# Patient Record
Sex: Female | Born: 1975 | Race: Black or African American | Hispanic: No | Marital: Single | Smoking: Never smoker
Health system: Southern US, Community
[De-identification: ages and names within clinical notes are randomized; demographics above are authoritative.]

---

## 2004-12-22 ENCOUNTER — Other Ambulatory Visit: Admission: RE | Admit: 2004-12-22 | Discharge: 2004-12-22 | Payer: Self-pay | Admitting: Obstetrics and Gynecology

## 2005-05-06 ENCOUNTER — Emergency Department (HOSPITAL_COMMUNITY): Admission: EM | Admit: 2005-05-06 | Discharge: 2005-05-06 | Payer: Self-pay | Admitting: Emergency Medicine

## 2005-05-06 ENCOUNTER — Emergency Department (HOSPITAL_COMMUNITY): Admission: EM | Admit: 2005-05-06 | Discharge: 2005-05-07 | Payer: Self-pay | Admitting: Emergency Medicine

## 2005-06-25 ENCOUNTER — Ambulatory Visit (HOSPITAL_COMMUNITY): Admission: RE | Admit: 2005-06-25 | Discharge: 2005-06-25 | Payer: Self-pay

## 2006-02-04 ENCOUNTER — Other Ambulatory Visit: Admission: RE | Admit: 2006-02-04 | Discharge: 2006-02-04 | Payer: Self-pay | Admitting: Obstetrics and Gynecology

## 2006-08-31 IMAGING — US US ABDOMEN COMPLETE
1 series · 14 of 25 positions shown · non-contrast
Comparison: none

CLINICAL DATA: Abdominal pain

[Series 1: unknown · 0.34mm/px · 14 of 54 slices shown]
[im 1/54]
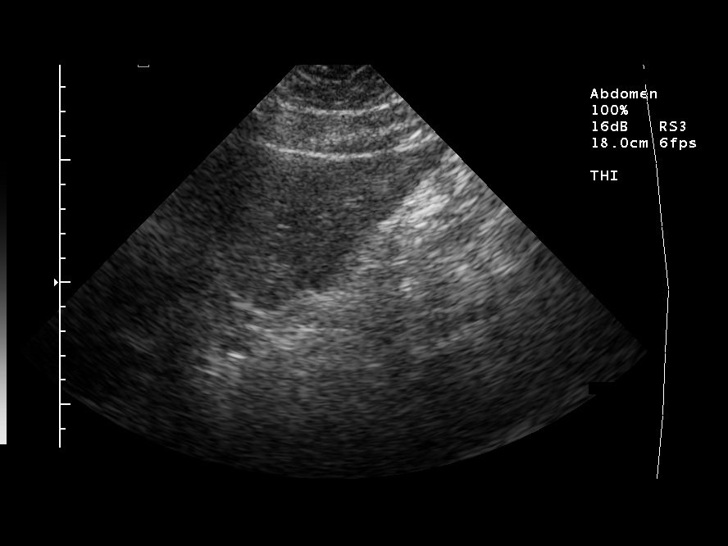
[im 5/54]
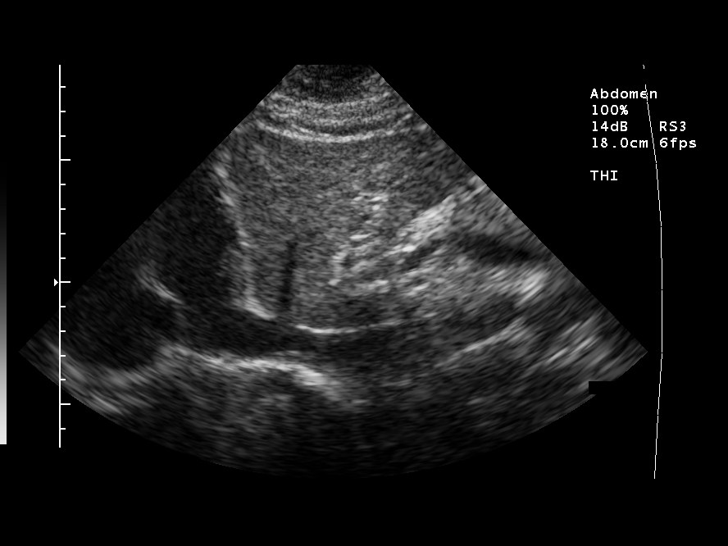
[im 9/54]
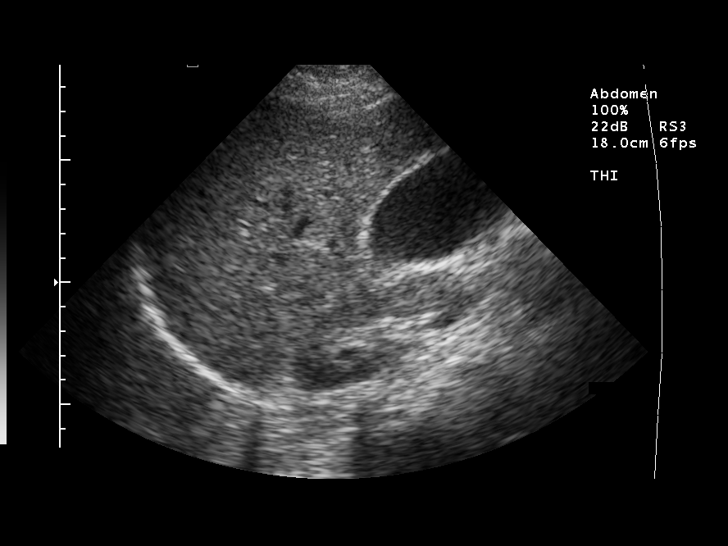
[im 14/54]
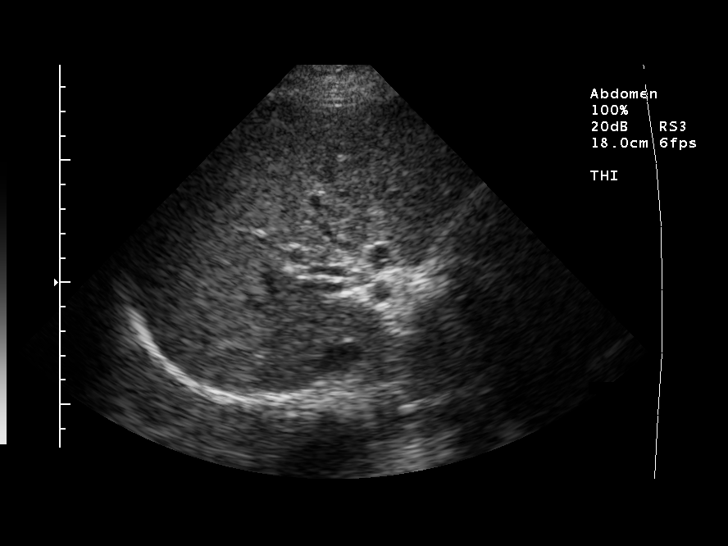
[im 18/54]
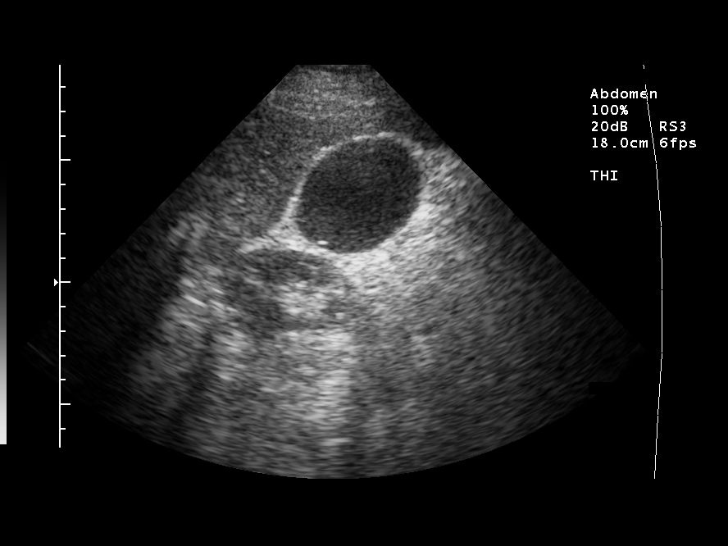
[im 20/54]
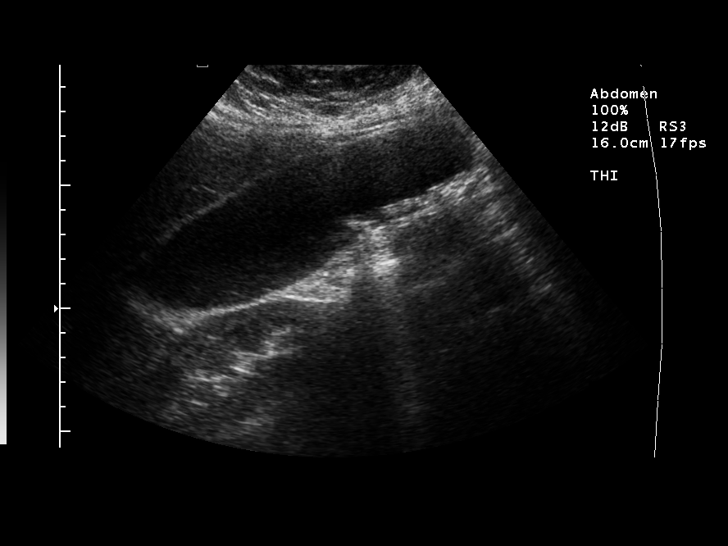
[im 25/54]
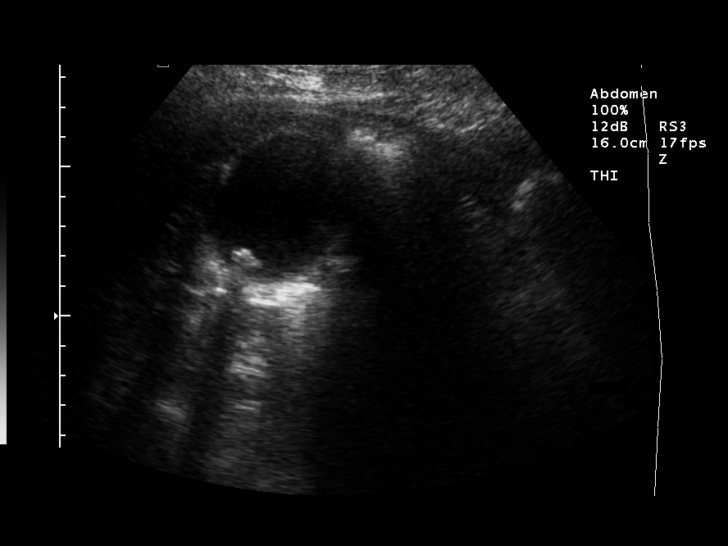
[im 29/54]
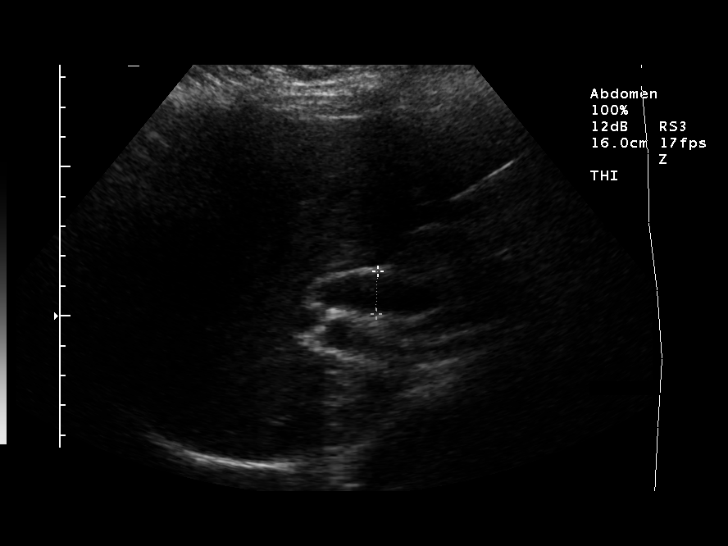
[im 34/54]
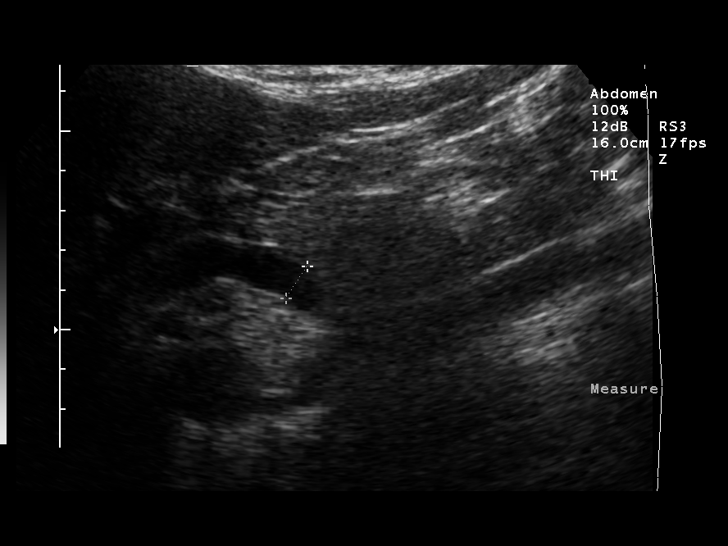
[im 36/54]
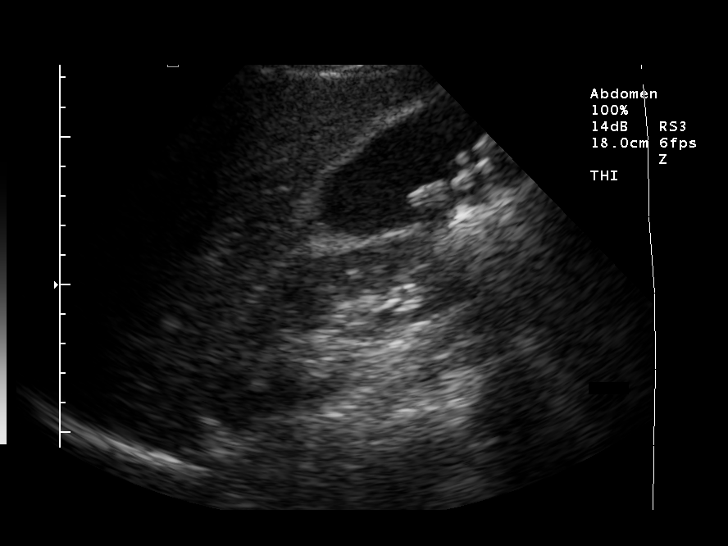
[im 40/54]
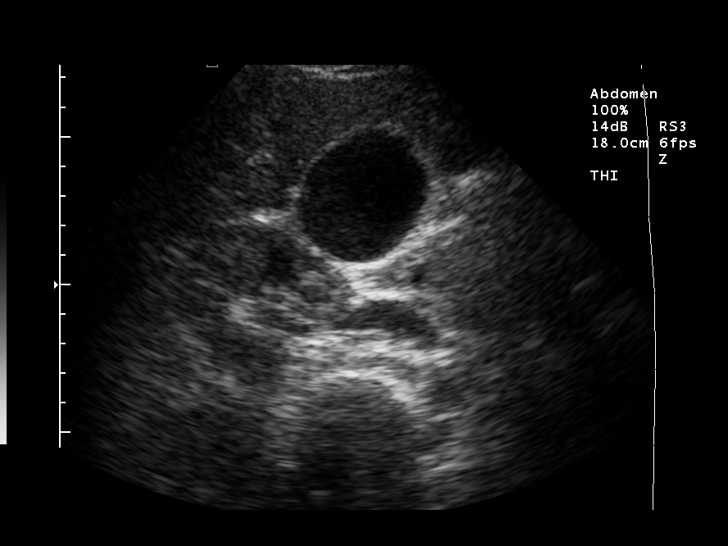
[im 45/54]
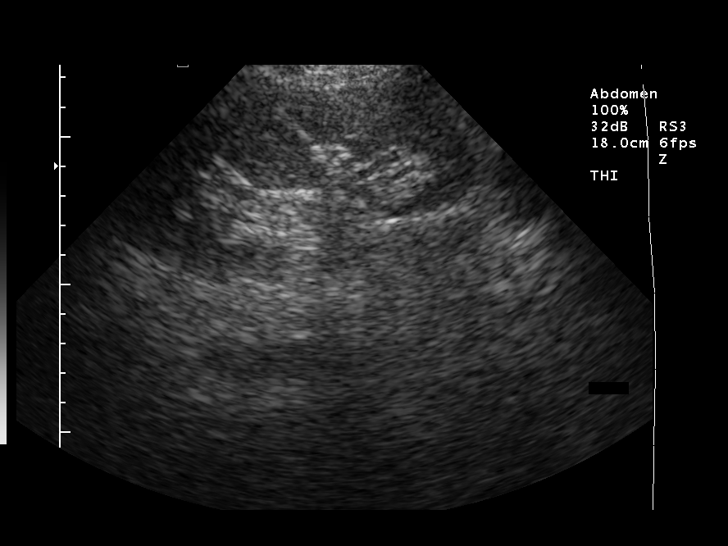
[im 49/54]
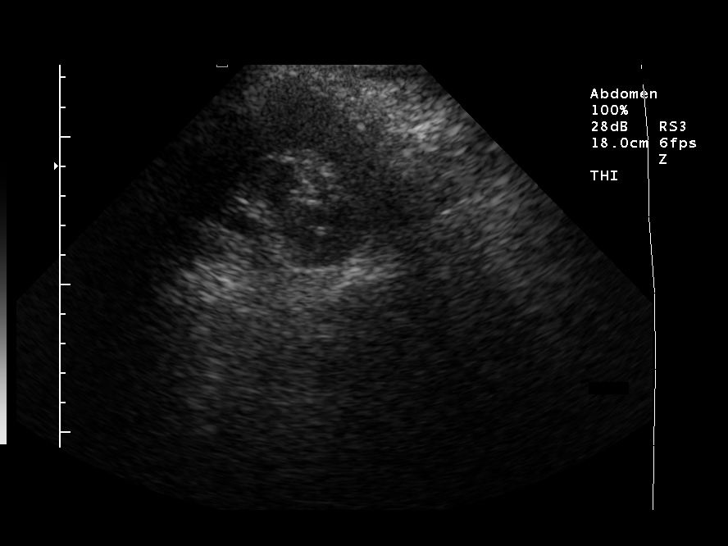
[im 54/54]
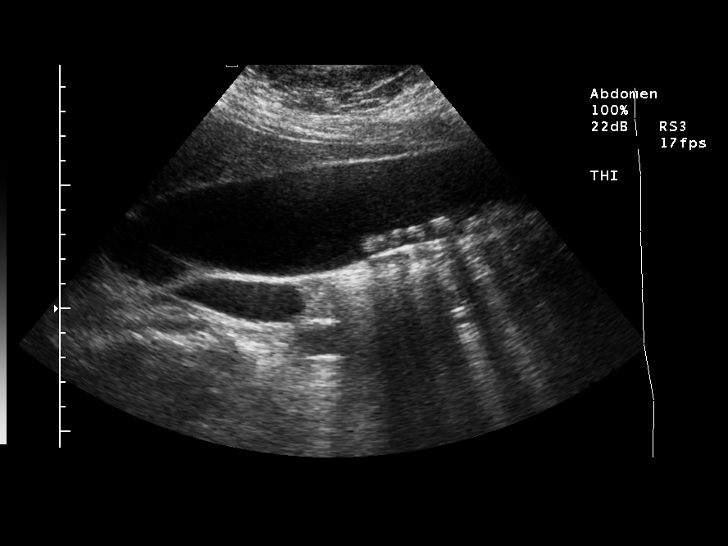

[14 of 25 positions shown; findings below may reference images not displayed]

Abdomen ultrasound:

No previous for comparison. Liver homogeneous in echotexture without focal
lesion or bile duct dilatation. Gallbladder is distended containing multiple
subcentimeter stones in its dependent portion. Gallbladder wall thickness
normal. No pericholecystic fluid. A positive sonographic Murphy's sign however
is elicited. The common bile duct is dilated to 14 mm. Pancreas, IVC, abdominal
aorta unremarkable. Spleen and kidneys unremarkable. No ascites.
IMPRESSION: 1. Cholelithiasis. A positive sonographic Murphy's sign would suggest acute
cholecystitis.
2. Dilated common bile duct although no definite intrahepatic bile duct
dilatation is identified.

## 2006-11-15 HISTORY — PX: OTHER SURGICAL HISTORY: SHX169

## 2012-11-12 ENCOUNTER — Ambulatory Visit (INDEPENDENT_AMBULATORY_CARE_PROVIDER_SITE_OTHER): Payer: BC Managed Care – PPO | Admitting: Family Medicine

## 2012-11-12 ENCOUNTER — Telehealth: Payer: Self-pay

## 2012-11-12 VITALS — BP 120/78 | HR 90 | Temp 98.4°F | Resp 16 | Ht 68.5 in | Wt 251.0 lb

## 2012-11-12 DIAGNOSIS — Z23 Encounter for immunization: Secondary | ICD-10-CM

## 2012-11-12 DIAGNOSIS — S61011A Laceration without foreign body of right thumb without damage to nail, initial encounter: Secondary | ICD-10-CM

## 2012-11-12 DIAGNOSIS — S61209A Unspecified open wound of unspecified finger without damage to nail, initial encounter: Secondary | ICD-10-CM

## 2012-11-12 DIAGNOSIS — M25549 Pain in joints of unspecified hand: Secondary | ICD-10-CM

## 2012-11-12 NOTE — Patient Instructions (Addendum)
WOUND CARE Please return in 10-12 days to have your stitches/staples removed or sooner if you have concerns. . Keep area clean and dry for 24 hours. Do not remove bandage, if applied. . After 24 hours, remove bandage and wash wound gently with mild soap and warm water. Reapply a new bandage after cleaning wound, if directed. . Continue daily cleansing with soap and water until stitches/staples are removed. . Do not apply any ointments or creams to the wound while stitches/staples are in place, as this may cause delayed healing. . Notify the office if you experience any of the following signs of infection: Swelling, redness, pus drainage, streaking, fever >101.0 F . Notify the office if you experience excessive bleeding that does not stop after 15-20 minutes of constant, firm pressure.   

## 2012-11-12 NOTE — Progress Notes (Signed)
Subjective: Patient was cutting onions and cut her right thumb. She was using a slicer and slipped and fell across it. She cut a long sliver of tissue along the radial aspect of the right thumb. There is a little slice into the nail. It is planned a lot. Her tetanus shot probably is not up-to-date.  Objective: Sliver of tissue greater than 2 CM small along the side of her right thumb distal phalanx. This is fairly superficial. There is a small cut of the nail.  Assessment: Wound right thumb  Plan: Physician assistant will do the suture repair.  TDAP

## 2012-11-12 NOTE — Progress Notes (Signed)
Patient ID: ELKE HOLTRY MRN: [id number], DOB: 1975/12/31, 36 y.o. Date of Encounter: 11/12/2012, 6:19 PM   PROCEDURE NOTE: Verbal consent obtained. Sterile technique employed. Numbing: Anesthesia obtained with 2% lidocaine plain.  Cleansed with soap and water. Irrigated.  Wound explored, no deep structures involved, no foreign bodies.   Wound repaired with 4-0 ethilon. Flap tacked down.   Hemostasis obtained. Wound cleansed and dressed.  Wound care instructions including precautions covered with patient. Handout given.  Anticipate suture removal in 10-12 days.   Rhoderick Moody, PA-C 11/12/2012 6:19 PM

## 2012-11-23 NOTE — Telephone Encounter (Signed)
Error

## 2012-11-27 ENCOUNTER — Ambulatory Visit (INDEPENDENT_AMBULATORY_CARE_PROVIDER_SITE_OTHER): Payer: BC Managed Care – PPO | Admitting: Family Medicine

## 2012-11-27 VITALS — BP 119/80 | HR 80 | Temp 98.0°F | Resp 16 | Ht 68.5 in | Wt 256.6 lb

## 2012-11-27 DIAGNOSIS — Z4802 Encounter for removal of sutures: Secondary | ICD-10-CM

## 2012-11-27 NOTE — Progress Notes (Signed)
  Subjective:    Patient ID: Patricia Joseph, female    DOB: 1976/05/26, 37 y.o.   MRN: [id number]  HPI  Pt here for suture removal.  Wound well healed, no complications. No drainage, swelling, pain, or erythema.    Review of Systems    BP 119/80  Pulse 80  Temp 98 F (36.7 C) (Oral)  Resp 16  Ht 5' 8.5" (1.74 m)  Wt 256 lb 9.6 oz (116.393 kg)  BMI 38.45 kg/m2  SpO2 100% Objective:   Physical Exam  Thumb with well healed laceration and many sutures in place. Laceration w/ mild scabbing, no tenderness, erythema, swelling, or drainage.      Assessment & Plan:   1. Encounter for removal of sutures   Removed by Oswaldo Milian w/o complications

## 2020-01-10 ENCOUNTER — Ambulatory Visit: Payer: Self-pay | Attending: Internal Medicine

## 2020-01-10 ENCOUNTER — Other Ambulatory Visit: Payer: Self-pay

## 2020-01-10 DIAGNOSIS — Z23 Encounter for immunization: Secondary | ICD-10-CM | POA: Insufficient documentation

## 2020-01-10 NOTE — Progress Notes (Signed)
   Covid-19 Vaccination Clinic  Name:  Patricia Joseph    MRN: [id number] DOB: 1976/10/31  01/10/2020  [name] was observed post Covid-19 immunization for 15 minutes without incidence. She was provided with Vaccine Information Sheet and instruction to access the V-Safe system.   [name] was instructed to call 911 with any severe reactions post vaccine: Marland Kitchen Difficulty breathing  . Swelling of your face and throat  . A fast heartbeat  . A bad rash all over your body  . Dizziness and weakness    Immunizations Administered    Name Date Dose VIS Date Route   Moderna COVID-19 Vaccine 01/10/2020  3:26 PM 0.5 mL 10/16/2019 Intramuscular   Manufacturer: Moderna   Lot: [id number]   NDC: 03212-248-25

## 2020-02-12 ENCOUNTER — Ambulatory Visit: Payer: Self-pay | Attending: Family

## 2020-02-12 DIAGNOSIS — Z23 Encounter for immunization: Secondary | ICD-10-CM

## 2020-02-12 NOTE — Progress Notes (Signed)
   Covid-19 Vaccination Clinic  Name:  Patricia Joseph    MRN: [id number] DOB: 1976/03/29  02/12/2020  [name] was observed post Covid-19 immunization for 15 minutes without incident. She was provided with Vaccine Information Sheet and instruction to access the V-Safe system.   [name] was instructed to call 911 with any severe reactions post vaccine: Marland Kitchen Difficulty breathing  . Swelling of face and throat  . A fast heartbeat  . A bad rash all over body  . Dizziness and weakness   Immunizations Administered    Name Date Dose VIS Date Route   Moderna COVID-19 Vaccine 02/12/2020  3:23 PM 0.5 mL 10/16/2019 Intramuscular   Manufacturer: Moderna   Lot: [id number]   NDC: 06582-608-88

## 2024-03-26 ENCOUNTER — Telehealth: Admitting: Physician Assistant

## 2024-03-26 DIAGNOSIS — L0390 Cellulitis, unspecified: Secondary | ICD-10-CM | POA: Diagnosis not present

## 2024-03-26 DIAGNOSIS — S60862A Insect bite (nonvenomous) of left wrist, initial encounter: Secondary | ICD-10-CM

## 2024-03-26 DIAGNOSIS — W57XXXA Bitten or stung by nonvenomous insect and other nonvenomous arthropods, initial encounter: Secondary | ICD-10-CM

## 2024-03-26 MED ORDER — CEPHALEXIN 500 MG PO CAPS: 500.0000 mg | ORAL_CAPSULE | Freq: Four times a day (QID) | ORAL | 0 refills | Status: AC

## 2024-03-26 MED ORDER — PREDNISONE 20 MG PO TABS: 20.0000 mg | ORAL_TABLET | Freq: Every day | ORAL | 0 refills | Status: AC

## 2024-03-26 NOTE — Progress Notes (Signed)
 Virtual Visit Consent   Patricia Joseph, you are scheduled for a virtual visit with a [facility] provider today. Just as with appointments in the office, your consent must be obtained to participate. Your consent will be active for this visit and any virtual visit you may have with one of our providers in the next 365 days. If you have a MyChart account, a copy of this consent can be sent to you electronically.  As this is a virtual visit, video technology does not allow for your provider to perform a traditional examination. This may limit your provider's ability to fully assess your condition. If your provider identifies any concerns that need to be evaluated in person or the need to arrange testing (such as labs, EKG, etc.), we will make arrangements to do so. Although advances in technology are sophisticated, we cannot ensure that it will always work on either your end or our end. If the connection with a video visit is poor, the visit may have to be switched to a telephone visit. With either a video or telephone visit, we are not always able to ensure that we have a secure connection.  By engaging in this virtual visit, you consent to the provision of healthcare and authorize for your insurance to be billed (if applicable) for the services provided during this visit. Depending on your insurance coverage, you may receive a charge related to this service.  I need to obtain your verbal consent now. Are you willing to proceed with your visit today? Aramis C Swartzentruber has provided verbal consent on 03/26/2024 for a virtual visit (video or telephone). Angelia Kelp, PA-C  Date: 03/26/2024 10:34 AM   Virtual Visit via Video Note   I, Angelia Kelp, connected with  Patricia Joseph  ([id number], [birth year]) on 03/26/24 at 10:30 AM EDT by a video-enabled telemedicine application and verified that I am speaking with the correct person using two identifiers.  Location: Patient: Virtual Visit  Location Patient: Other: work, isolated Provider: Engineer, mining Provider: Home Office   I discussed the limitations of evaluation and management by telemedicine and the availability of in person appointments. The patient expressed understanding and agreed to proceed.    History of Present Illness: Patricia Joseph is a 48 y.o. who identifies as a female who was assigned female at birth, and is being seen today for insect bite of left wrist. Had [place] the zoo [place], [place]24[place]24. Yesterday noticed her left wrist was itching. This morning the left wrist (dorsal) was red, swollen, and warm to the touch. There is no pain. It does still itch. Denies fevers, chills, nausea, vomiting.   Problems: There are no active problems to display for this patient.   Allergies: No Known Allergies Medications:  Current Outpatient Medications:    cephALEXin (KEFLEX) 500 MG capsule, Take 1 capsule (500 mg total) by mouth 4 (four) times daily., Disp: 20 capsule, Rfl: 0   predniSONE (DELTASONE) 20 MG tablet, Take 1 tablet (20 mg total) by mouth daily with breakfast., Disp: 5 tablet, Rfl: 0  Observations/Objective: Patient is well-developed, well-nourished in no acute distress.  Resting comfortably at home.  Head is normocephalic, atraumatic.  No labored breathing.  Speech is clear and coherent with logical content.  Patient is alert and oriented at baseline.  Left wrist dorsal aspect is red, swollen. Bite mark is small but noted over the left distal radius area with surrounding redness and swelling extending into the back of  the hand and own about a 3rd of the forearm from the bite, patient reports warm to touch, not painful  Assessment and Plan: 1. Wound cellulitis (Primary) - cephALEXin (KEFLEX) 500 MG capsule; Take 1 capsule (500 mg total) by mouth 4 (four) times daily.  Dispense: 20 capsule; Refill: 0 - predniSONE (DELTASONE) 20 MG tablet; Take 1 tablet (20 mg total) by mouth daily with  breakfast.  Dispense: 5 tablet; Refill: 0  2. Insect bite of left wrist, initial encounter - cephALEXin (KEFLEX) 500 MG capsule; Take 1 capsule (500 mg total) by mouth 4 (four) times daily.  Dispense: 20 capsule; Refill: 0 - predniSONE (DELTASONE) 20 MG tablet; Take 1 tablet (20 mg total) by mouth daily with breakfast.  Dispense: 5 tablet; Refill: 0  - Suspect insect bite or sting, most likely from the zoo trip - Keflex for surrounding cellulitis - Prednisone for acute histamine reaction - Can apply topical benadryl cream for itching - Can take benadryl or a 24 hr non-drowsy antihistamine for itching if needed - Can apply topical hydrocortisone cream for itching if needed - Can apply Calamine lotion topically for itching if needed - Cold compresses - Seek in person evaluation if not improving or if worsens  Follow Up Instructions: I discussed the assessment and treatment plan with the patient. The patient was provided an opportunity to ask questions and all were answered. The patient agreed with the plan and demonstrated an understanding of the instructions.  A copy of instructions were sent to the patient via MyChart unless otherwise noted below.    The patient was advised to call back or seek an in-person evaluation if the symptoms worsen or if the condition fails to improve as anticipated.    Angelia Kelp, PA-C

## 2024-03-26 NOTE — Patient Instructions (Signed)
 Jeanine Millers, thank you for joining Angelia Kelp, PA-C for today's virtual visit.  While this provider is not your primary care provider (PCP), if your PCP is located in our provider database this encounter information will be shared with them immediately following your visit.   A [facility] MyChart account gives you access to today's visit and all your visits, tests, and labs performed at [facility] " click here if you don't have a [facility] MyChart account or go to [email or web address]  Consent: (Patient) Patricia Joseph provided verbal consent for this virtual visit at the beginning of the encounter.  Current Medications:  Current Outpatient Medications:    cephALEXin (KEFLEX) 500 MG capsule, Take 1 capsule (500 mg total) by mouth 4 (four) times daily., Disp: 20 capsule, Rfl: 0   predniSONE (DELTASONE) 20 MG tablet, Take 1 tablet (20 mg total) by mouth daily with breakfast., Disp: 5 tablet, Rfl: 0   Medications ordered in this encounter:  Meds ordered this encounter  Medications   cephALEXin (KEFLEX) 500 MG capsule    Sig: Take 1 capsule (500 mg total) by mouth 4 (four) times daily.    Dispense:  20 capsule    Refill:  0    Supervising Provider:   LAMPTEY, PHILIP O [[id number]]   predniSONE (DELTASONE) 20 MG tablet    Sig: Take 1 tablet (20 mg total) by mouth daily with breakfast.    Dispense:  5 tablet    Refill:  0    Supervising Provider:   LAMPTEY, PHILIP O [[id number]]     *If you need refills on other medications prior to your next appointment, please contact your pharmacy*  Follow-Up: Call back or seek an in-person evaluation if the symptoms worsen or if the condition fails to improve as anticipated.  [facility] [phone]  Other Instructions  Insect Bite, Adult An insect bite can make your skin red, itchy, and swollen. An insect bite is different from an insect sting, which happens when an insect injects poison (venom)  into the skin. Some insects can spread disease to people through a bite. However, most insect bites do not lead to disease and are not serious. What are the causes? Insects may bite for a variety of reasons, including: Hunger. To defend themselves. Insects that bite include: Spiders. Mosquitoes and flies. Ticks and fleas. Ants. Kissing bugs. Chiggers. What are the signs or symptoms? In many cases, symptoms last for 2-4 days. However, itching can last up to 10 days. Symptoms include: Itching or pain in the bite area. Redness and swelling in the bite area. An open wound (skin ulcer). In rare cases, a person may have a severe allergic reaction (anaphylactic reaction) to a bite. Symptoms of an anaphylactic reaction may include: Feeling warm in the face (flushed). This may include redness. Itchy, red, swollen areas of skin (hives). Swelling of the eyes, lips, face, mouth, tongue, or throat. Wheezing or difficulty breathing, speaking, or swallowing. Dizziness, light-headedness, or fainting. Abdominal symptoms like cramping, nausea, vomiting, or diarrhea. How is this diagnosed? This condition is usually diagnosed based on symptoms and a physical exam. During the exam, your health care provider will look at the bite and ask you what kind of insect bit you. How is this treated? Most insect bites are not serious. Symptoms often go away on their own and treatment is not usually needed. When treatment is recommended, it may include: Applying ice to the affected area. Applying steroid or other  anti-itch creams, like calamine lotion, to the bite area. Medicines called antihistamines to reduce itching. You may also need: A tetanus shot if you are not up to date. Antibiotic cream or an oral antibiotic if the bite becomes infected (this is uncommon). Follow these instructions at home: Bite area care  Do not scratch the bite area. It may help to cover the bite area with a bandage or close-fitting  clothing. Keep the bite area clean and dry. Wash it every day with soap and water as told by your health care provider. Check the bite area every day for signs of infection. Check for: More redness, swelling, or pain. Fluid or blood. Warmth. Pus or a bad smell. Managing pain, itching, and swelling  You may apply cortisone cream, calamine lotion, or a paste made of baking soda and water to the bite area as told by your health care provider. If directed, put ice on the bite area. To do this: Put ice in a plastic bag. Place a towel between your skin and the bag. Leave the ice on for 20 minutes, 2-3 times a day. If your skin turns bright red, remove the ice right away to prevent skin damage. The risk of skin damage is higher if you cannot feel pain, heat, or cold. General instructions Apply or take over-the-counter and prescription medicine only as told by your health care provider. If you were prescribed antibiotics, take or apply them as told by your health care provider. Do not stop using the antibiotic even if you start to feel better. How is this prevented? To help reduce your risk of insect bites: When you are outdoors, wear clothing that covers your arms and legs. This is especially important in the early morning and evening. Use insect repellent. The best insect repellents contain DEET, picaridin, oil of lemon eucalyptus (OLE), or IR3535. Consider spraying your clothing with a pesticide called permethrin. Permethrin helps prevent insect bites. It works for several weeks and for up to 5-6 clothing washes. Do not apply permethrin directly to the skin. If your home windows do not have screens, consider installing them. If you will be sleeping in an area where there are mosquitoes, consider covering your sleeping area with a mosquito net. Contact a health care provider if: Your bite area has signs of infection, such as: More redness, swelling, or pain. Fluid or blood. Warmth. Pus or a  bad smell. You have a fever. Get help right away if: You have a rash. You have muscle or joint pain. You feel unusually tired or weak. You have neck pain or a headache. You develop symptoms of an anaphylactic reaction. These may include: Swelling of the eyes, lips, face, mouth, tongue, or throat. Flushed skin or hives. Wheezing. Difficulty breathing, speaking, or swallowing. Dizziness, light-headedness, or fainting. Abdominal pain, cramping, vomiting, or diarrhea. These symptoms may be an emergency. Get help right away. Call 911. Do not wait to see if the symptoms will go away. Do not drive yourself to the hospital. Summary An insect bite can make your skin red, itchy, and swollen. Treatment is usually not needed. Symptoms often go away on their own. When treatment is recommended, it may involve taking medicine, applying medicine to the area, or applying ice. Apply or take over-the-counter and prescription medicines only as told by your health care provider. Use insect repellent to help prevent insect bites. Contact a health care provider if your bite area has signs of infection. This information is not intended to replace  advice given to you by your health care provider. Make sure you discuss any questions you have with your health care provider. Document Revised: 02/10/2022 Document Reviewed: 01/26/2022 Elsevier Patient Education  2024 [employer]   If you have been instructed to have an in-person evaluation today at a local Urgent Care facility, please use the link below. It will take you to a list of all of our available [facility], including address, phone number and hours of operation. Please do not delay care.  [facility]  If you or a family member do not have a primary care provider, use the link below to schedule a visit and establish care. When you choose a [facility] primary care physician or advanced practice provider, you gain a long-term  partner in health. Find a Primary Care Provider  Learn more about [facility] in-office and virtual care options:  - Get Care Now
